# Patient Record
Sex: Female | Born: 1995 | Race: White | Hispanic: No | Marital: Married | Smoking: Former smoker
Health system: Southern US, Community
[De-identification: ages and names within clinical notes are randomized; demographics above are authoritative.]

## PROBLEM LIST (undated history)

## (undated) ENCOUNTER — Inpatient Hospital Stay (HOSPITAL_COMMUNITY): Payer: Self-pay

## (undated) HISTORY — PX: TONSILLECTOMY: SUR1361

---

## 2017-03-26 ENCOUNTER — Inpatient Hospital Stay (HOSPITAL_COMMUNITY)
Admission: AD | Admit: 2017-03-26 | Discharge: 2017-03-26 | Disposition: A | Discharge status: Home / Self Care | Payer: Medicaid Other | Source: Ambulatory Visit | Attending: Obstetrics and Gynecology | Admitting: Obstetrics and Gynecology

## 2017-03-26 ENCOUNTER — Inpatient Hospital Stay (HOSPITAL_COMMUNITY): Payer: Medicaid Other

## 2017-03-26 ENCOUNTER — Encounter (HOSPITAL_COMMUNITY): Payer: Self-pay

## 2017-03-26 DIAGNOSIS — R102 Pelvic and perineal pain: Secondary | ICD-10-CM | POA: Diagnosis not present

## 2017-03-26 DIAGNOSIS — Z3A01 Less than 8 weeks gestation of pregnancy: Secondary | ICD-10-CM | POA: Insufficient documentation

## 2017-03-26 DIAGNOSIS — O3481 Maternal care for other abnormalities of pelvic organs, first trimester: Secondary | ICD-10-CM | POA: Diagnosis not present

## 2017-03-26 DIAGNOSIS — N83201 Unspecified ovarian cyst, right side: Secondary | ICD-10-CM

## 2017-03-26 DIAGNOSIS — O26891 Other specified pregnancy related conditions, first trimester: Secondary | ICD-10-CM | POA: Diagnosis not present

## 2017-03-26 DIAGNOSIS — Z87891 Personal history of nicotine dependence: Secondary | ICD-10-CM | POA: Diagnosis not present

## 2017-03-26 DIAGNOSIS — R109 Unspecified abdominal pain: Secondary | ICD-10-CM | POA: Diagnosis present

## 2017-03-26 LAB — URINALYSIS, ROUTINE W REFLEX MICROSCOPIC
BILIRUBIN URINE: NEGATIVE
Glucose, UA: 50 mg/dL — AB
Hgb urine dipstick: NEGATIVE
KETONES UR: NEGATIVE mg/dL
LEUKOCYTES UA: NEGATIVE
NITRITE: NEGATIVE
PROTEIN: NEGATIVE mg/dL
Specific Gravity, Urine: 1.002 — ABNORMAL LOW (ref 1.005–1.030)
pH: 7 (ref 5.0–8.0)

## 2017-03-26 LAB — HCG, QUANTITATIVE, PREGNANCY: hCG, Beta Chain, Quant, S: 40924 m[IU]/mL — ABNORMAL HIGH (ref <5)

## 2017-03-26 LAB — WET PREP, GENITAL
Clue Cells Wet Prep HPF POC: NONE SEEN
Sperm: NONE SEEN
TRICH WET PREP: NONE SEEN
Yeast Wet Prep HPF POC: NONE SEEN

## 2017-03-26 LAB — CBC
HCT: 34.3 % — ABNORMAL LOW (ref 36.0–46.0)
Hemoglobin: 12.1 g/dL (ref 12.0–15.0)
MCH: 33.2 pg (ref 26.0–34.0)
MCHC: 35.3 g/dL (ref 30.0–36.0)
MCV: 94.2 fL (ref 78.0–100.0)
PLATELETS: 333 10*3/uL (ref 150–400)
RBC: 3.64 MIL/uL — AB (ref 3.87–5.11)
RDW: 12.9 % (ref 11.5–15.5)
WBC: 13.9 10*3/uL — AB (ref 4.0–10.5)

## 2017-03-26 LAB — POCT PREGNANCY, URINE: Preg Test, Ur: POSITIVE — AB

## 2017-03-26 NOTE — MAU Note (Addendum)
Having pain in lower abd for couple wks and getting worse. Denies vag bleeding or d/c. LMP 02/11/17. Some nausea. No diarrhea. Last BM yesterday and normal. Tried Ibuprofen but have to take 5 at a time to help and hurt upper stomach so has been wks since taking anything for pain

## 2017-03-26 NOTE — MAU Provider Note (Signed)
Chief Complaint: Abdominal Pain   First Provider Initiated Contact with Patient 03/26/17 2105        SUBJECTIVE HPI: Holly Palmer is a 21 y.o. G2P0 at [redacted]w[redacted]d by LMP who presents to maternity admissions reporting lower abdominal pain for 2 weeks. Feels like menstrual cramps.  Did miss period but thought it was just related to something else.  Was not trying for conception but not preventing.   Did not know she was pregnant. Has some nausea.. She denies vaginal bleeding, vaginal itching/burning, urinary symptoms, h/a, dizziness, n/v, or fever/chills.    Abdominal Pain  This is a new problem. The current episode started 1 to 4 weeks ago. The onset quality is gradual. The problem occurs intermittently. The problem has been unchanged. The pain is located in the LLQ, RLQ and suprapubic region. The quality of the pain is cramping. The abdominal pain does not radiate. Associated symptoms include nausea. Pertinent negatives include no constipation, diarrhea (did have some loose stools earlier this week), dysuria, fever, headaches, myalgias or vomiting. The pain is aggravated by palpation. The pain is relieved by nothing. Treatments tried: ibuprofen. The treatment provided mild relief.   RN note:: Having pain in lower abd for couple wks and getting worse. Denies vag bleeding or d/c. LMP 02/11/17. Some nausea. No diarrhea. Last BM yesterday and normal. Tried Ibuprofen but have to take 5 at a time to help and hurt upper stomach so has been wks since taking anything for pain  History reviewed. No pertinent past medical history. Past Surgical History:  Procedure Laterality Date  . TONSILLECTOMY     Social History   Social History  . Marital status: Married    Spouse name: N/A  . Number of children: N/A  . Years of education: N/A   Occupational History  . Not on file.   Social History Main Topics  . Smoking status: Former Smoker    Years: 1.00  . Smokeless tobacco: Never Used  . Alcohol use No  .  Drug use: No  . Sexual activity: Yes    Birth control/ protection: None   Other Topics Concern  . Not on file   Social History Narrative  . No narrative on file   No current facility-administered medications on file prior to encounter.    No current outpatient prescriptions on file prior to encounter.   Allergies  Allergen Reactions  . Lamictal [Lamotrigine] Rash    I have reviewed patient's Past Medical Hx, Surgical Hx, Family Hx, Social Hx, medications and allergies.   ROS:  Review of Systems  Constitutional: Negative for fever.  Gastrointestinal: Positive for abdominal pain and nausea. Negative for constipation, diarrhea (did have some loose stools earlier this week) and vomiting.  Genitourinary: Negative for dysuria.  Musculoskeletal: Negative for myalgias.  Neurological: Negative for headaches.   Review of Systems  Other systems negative   Physical Exam  Physical Exam Patient Vitals for the past 24 hrs:  BP Temp Temp src Pulse Resp SpO2 Height Weight  03/26/17 2101 100/67 98.5 F (36.9 C) Oral 92 16 - - -  03/26/17 1940 - - - (!) 106 - 100 % - -  03/26/17 1939 108/74 99.3 F (37.4 C) - (!) 117 18 -  (1.499 m) 91 lb (41.3 kg)   Constitutional: Well-developed, well-nourished female in no acute distress.  Cardiovascular: normal rate Respiratory: normal effort GI: Abd soft, non-tender. Pos BS x 4 MS: Extremities nontender, no edema, normal ROM Neurologic: Alert and oriented  x 4.  GU: Neg CVAT.  PELVIC EXAM: Cervix pink, visually closed, without lesion, scant white creamy discharge, vaginal walls and external genitalia normal Bimanual exam: Cervix 0/long/high, firm, anterior, neg CMT, uterus tender, 5-6wk size, adnexa with some tenderness on right,  No enlargement, or mass   LAB RESULTS Results for orders placed or performed during the hospital encounter of 03/26/17 (from the past 24 hour(s))  Urinalysis, Routine w reflex microscopic     Status: Abnormal    Collection Time: 03/26/17  7:45 PM  Result Value Ref Range   Color, Urine COLORLESS (A) YELLOW   APPearance CLEAR CLEAR   Specific Gravity, Urine 1.002 (L) 1.005 - 1.030   pH 7.0 5.0 - 8.0   Glucose, UA 50 (A) NEGATIVE mg/dL   Hgb urine dipstick NEGATIVE NEGATIVE   Bilirubin Urine NEGATIVE NEGATIVE   Ketones, ur NEGATIVE NEGATIVE mg/dL   Protein, ur NEGATIVE NEGATIVE mg/dL   Nitrite NEGATIVE NEGATIVE   Leukocytes, UA NEGATIVE NEGATIVE  Pregnancy, urine POC     Status: Abnormal   Collection Time: 03/26/17  8:47 PM  Result Value Ref Range   Preg Test, Ur POSITIVE (A) NEGATIVE  Wet prep, genital     Status: Abnormal   Collection Time: 03/26/17  9:13 PM  Result Value Ref Range   Yeast Wet Prep HPF POC NONE SEEN NONE SEEN   Trich, Wet Prep NONE SEEN NONE SEEN   Clue Cells Wet Prep HPF POC NONE SEEN NONE SEEN   WBC, Wet Prep HPF POC FEW (A) NONE SEEN   Sperm NONE SEEN   CBC     Status: Abnormal   Collection Time: 03/26/17  9:30 PM  Result Value Ref Range   WBC 13.9 (H) 4.0 - 10.5 K/uL   RBC 3.64 (L) 3.87 - 5.11 MIL/uL   Hemoglobin 12.1 12.0 - 15.0 g/dL   HCT 16.1 (L) 09.6 - 04.5 %   MCV 94.2 78.0 - 100.0 fL   MCH 33.2 26.0 - 34.0 pg   MCHC 35.3 30.0 - 36.0 g/dL   RDW 40.9 81.1 - 91.4 %   Platelets 333 150 - 400 K/uL       IMAGING Single IUP at [redacted]w[redacted]d Simple right ovarian cyst, about 5cm  MAU Management/MDM: Ordered usual first trimester r/o ectopic labs.   Pelvic exam and cultures done Will check baseline Ultrasound to rule out ectopic.  This bleeding/pain can represent a normal pregnancy with bleeding, spontaneous abortion or even an ectopic which can be life-threatening.  The process as listed above helps to determine which of these is present.  Discussed results of [place].  Discussed pain is likely due to simple cyst on right.   ASSESSMENT Pregnancy of unknown location  >> live single IUP at [redacted]w[redacted]d Pelvic pain in early pregnancy  PLAN Discharge home Comfort  measures Encouraged to seek PNcare Pt stable at time of discharge. Encouraged to return here or to other Urgent Care/ED if she develops worsening of symptoms, increase in pain, fever, or other concerning symptoms.    Wynelle Bourgeois CNM, MSN Certified Nurse-Midwife 03/26/2017  9:06 PM

## 2017-03-26 NOTE — Discharge Instructions (Signed)
Ovarian Cyst An ovarian cyst is a fluid-filled sac on an ovary. The ovaries are organs that make eggs in women. Most ovarian cysts go away on their own and are not cancerous (are benign). Some cysts need treatment. Follow these instructions at home:  Take over-the-counter and prescription medicines only as told by your doctor.  Do not drive or use heavy machinery while taking prescription pain medicine.  Get pelvic exams and Pap tests as often as told by your doctor.  Return to your normal activities as told by your doctor. Ask your doctor what activities are safe for you.  Do not use any products that contain nicotine or tobacco, such as cigarettes and e-cigarettes. If you need help quitting, ask your doctor.  Keep all follow-up visits as told by your doctor. This is important. Contact a doctor if:  Your periods are:  Late.  Irregular.  Painful.  Your periods stop.  You have pelvic pain that does not go away.  You have pressure on your bladder.  You have trouble making your bladder empty when you pee (urinate).  You have pain during sex.  You have any of the following in your belly (abdomen):  A feeling of fullness.  Pressure.  Discomfort.  Pain that does not go away.  Swelling.  You feel sick most of the time.  You have trouble pooping (have constipation).  You are not as hungry as usual (you lose your appetite).  You get very bad acne.  You start to have more hair on your body and face.  You are gaining weight or losing weight without changing your exercise and eating habits.  You think you may be pregnant. Get help right away if:  You have belly pain that is very bad or gets worse.  You cannot eat or drink without throwing up (vomiting).  You suddenly get a fever.  Your period is a lot heavier than usual. This information is not intended to replace advice given to you by your health care provider. Make sure you discuss any questions you have  with your health care provider. Document Released: 06/01/2008 Document Revised: 07/03/2016 Document Reviewed: 05/17/2016 Elsevier Interactive Patient Education  2017 [employer].   First Trimester of Pregnancy The first trimester of pregnancy is from week 1 until the end of week 13 (months 1 through 3). During this time, your baby will begin to develop inside you. At 6-8 weeks, the eyes and face are formed, and the heartbeat can be seen on ultrasound. At the end of 12 weeks, all the baby's organs are formed. Prenatal care is all the medical care you receive before the birth of your baby. Make sure you get good prenatal care and follow all of your doctor's instructions. Follow these instructions at home: Medicines   Take over-the-counter and prescription medicines only as told by your doctor. Some medicines are safe and some medicines are not safe during pregnancy.  Take a prenatal vitamin that contains at least 600 micrograms (mcg) of folic acid.  If you have trouble pooping (constipation), take medicine that will make your stool soft (stool softener) if your doctor approves. Eating and drinking   Eat regular, healthy meals.  Your doctor will tell you the amount of weight gain that is right for you.  Avoid raw meat and uncooked cheese.  If you feel sick to your stomach (nauseous) or throw up (vomit):  Eat 4 or 5 small meals a day instead of 3 large meals.  Try  eating a few soda crackers.  Drink liquids between meals instead of during meals.  To prevent constipation:  Eat foods that are high in fiber, like fresh fruits and vegetables, whole grains, and beans.  Drink enough fluids to keep your pee (urine) clear or pale yellow. Activity   Exercise only as told by your doctor. Stop exercising if you have cramps or pain in your lower belly (abdomen) or low back.  Do not exercise if it is too hot, too humid, or if you are in a place of great height (high altitude).  Try to avoid  standing for long periods of time. Move your legs often if you must stand in one place for a long time.  Avoid heavy lifting.  Wear low-heeled shoes. Sit and stand up straight.  You can have sex unless your doctor tells you not to. Relieving pain and discomfort   Wear a good support bra if your breasts are sore.  Take warm water baths (sitz baths) to soothe pain or discomfort caused by hemorrhoids. Use hemorrhoid cream if your doctor says it is okay.  Rest with your legs raised if you have leg cramps or low back pain.  If you have puffy, bulging veins (varicose veins) in your legs:  Wear support hose or compression stockings as told by your doctor.  Raise (elevate) your feet for 15 minutes, 3-4 times a day.  Limit salt in your food. Prenatal care   Schedule your prenatal visits by the twelfth week of pregnancy.  Write down your questions. Take them to your prenatal visits.  Keep all your prenatal visits as told by your doctor. This is important. Safety   Wear your seat belt at all times when driving.  Make a list of emergency phone numbers. The list should include numbers for family, friends, the hospital, and police and fire departments. General instructions   Ask your doctor for a referral to a local prenatal class. Begin classes no later than at the start of month 6 of your pregnancy.  Ask for help if you need counseling or if you need help with nutrition. Your doctor can give you advice or tell you where to go for help.  Do not use hot tubs, steam rooms, or saunas.  Do not douche or use tampons or scented sanitary pads.  Do not cross your legs for long periods of time.  Avoid all herbs and alcohol. Avoid drugs that are not approved by your doctor.  Do not use any tobacco products, including cigarettes, chewing tobacco, and electronic cigarettes. If you need help quitting, ask your doctor. You may get counseling or other support to help you quit.  Avoid cat litter  boxes and soil used by cats. These carry germs that can cause birth defects in the baby and can cause a loss of your baby (miscarriage) or stillbirth.  Visit your dentist. At home, brush your teeth with a soft toothbrush. Be gentle when you floss. Contact a doctor if:  You are dizzy.  You have mild cramps or pressure in your lower belly.  You have a nagging pain in your belly area.  You continue to feel sick to your stomach, you throw up, or you have watery poop (diarrhea).  You have a bad smelling fluid coming from your vagina.  You have pain when you pee (urinate).  You have increased puffiness (swelling) in your face, hands, legs, or ankles. Get help right away if:  You have a fever.  You  are leaking fluid from your vagina.  You have spotting or bleeding from your vagina.  You have very bad belly cramping or pain.  You gain or lose weight rapidly.  You throw up blood. It may look like coffee grounds.  You are around people who have [place] measles, fifth disease, or chickenpox.  You have a very bad headache.  You have shortness of breath.  You have any kind of trauma, such as from a fall or a car accident. Summary  The first trimester of pregnancy is from week 1 until the end of week 13 (months 1 through 3).  To take care of yourself and your unborn baby, you will need to eat healthy meals, take medicines only if your doctor tells you to do so, and do activities that are safe for you and your baby.  Keep all follow-up visits as told by your doctor. This is important as your doctor will have to ensure that your baby is healthy and growing well. This information is not intended to replace advice given to you by your health care provider. Make sure you discuss any questions you have with your health care provider. Document Released: 06/01/2008 Document Revised: 12/22/2016 Document Reviewed: 12/22/2016 Elsevier Interactive Patient Education  2017 [employer].

## 2017-03-27 LAB — HIV ANTIBODY (ROUTINE TESTING W REFLEX): HIV Screen 4th Generation wRfx: NONREACTIVE

## 2017-03-27 LAB — ABO/RH: ABO/RH(D): O POS

## 2017-03-29 LAB — GC/CHLAMYDIA PROBE AMP (~~LOC~~) NOT AT ARMC
Chlamydia: NEGATIVE
NEISSERIA GONORRHEA: NEGATIVE

## 2017-09-13 IMAGING — US US OB TRANSVAGINAL
1 series · 15 of 28 positions shown · non-contrast
Comparison: None.

CLINICAL DATA: Initial evaluation for acute right lower quadrant
pain for 2 weeks. Currently pregnant. Beta HCG is pending.

EXAM:
OBSTETRIC <14 WK US AND TRANSVAGINAL OB US
TECHNIQUE: Both transabdominal and transvaginal ultrasound examinations were
performed for complete evaluation of the gestation as well as the
maternal uterus, adnexal regions, and pelvic cul-de-sac.
Transvaginal technique was performed to assess early pregnancy.

[Series 1: us ob transvaginal · 15 of 82 slices shown]
[im 1/82]
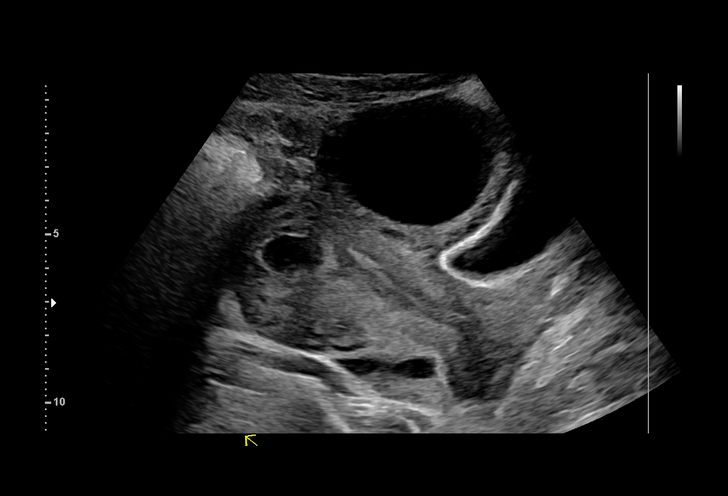
[im 7/82]
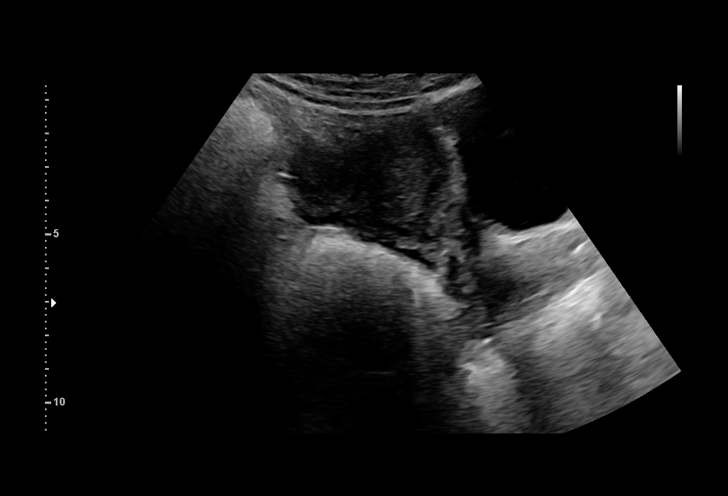
[im 13/82]
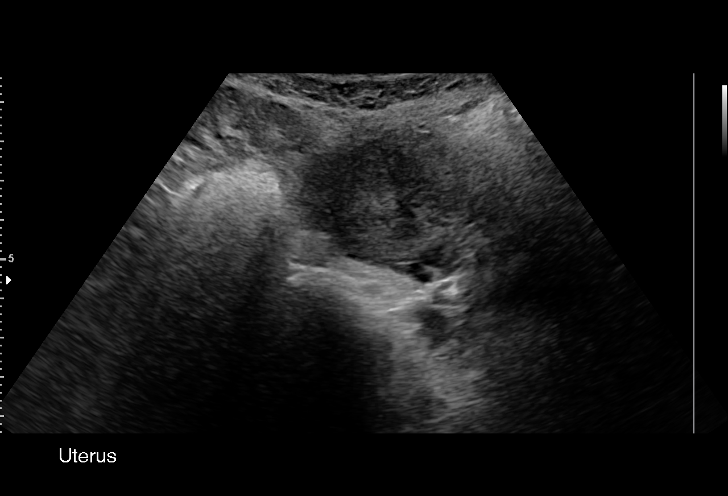
[im 19/82]
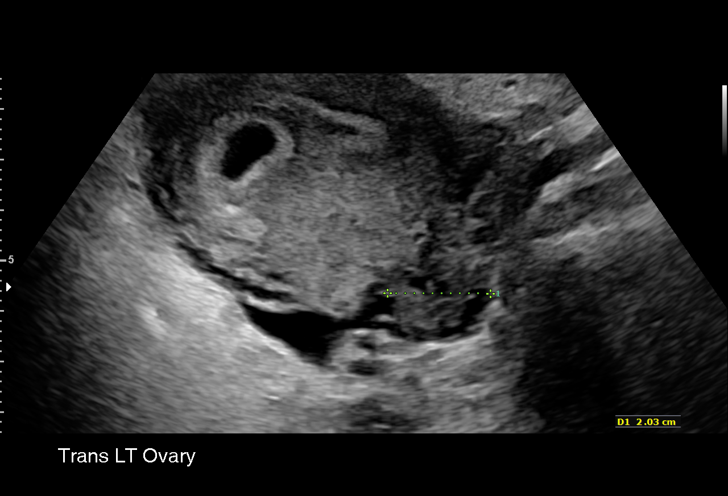
[im 25/82]
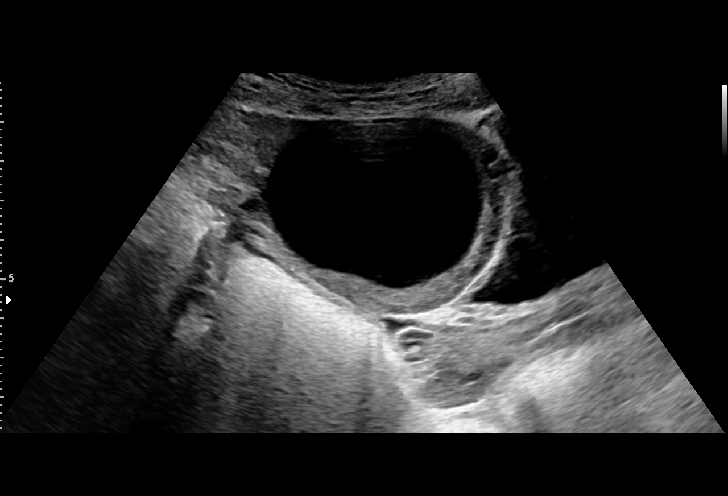
[im 31/82]
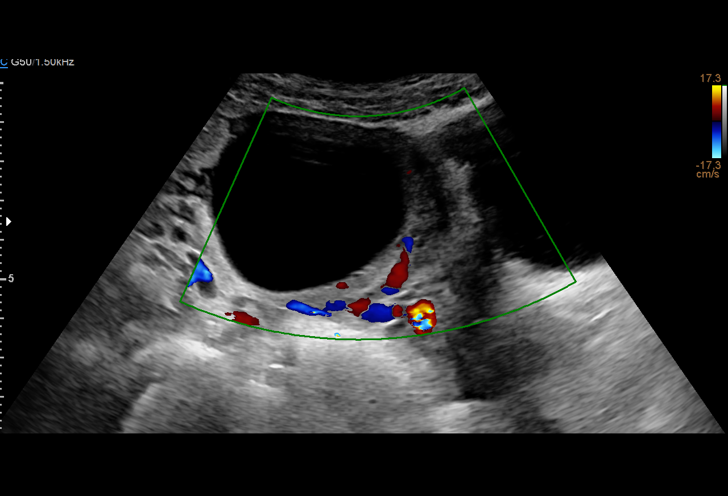
[im 37/82]
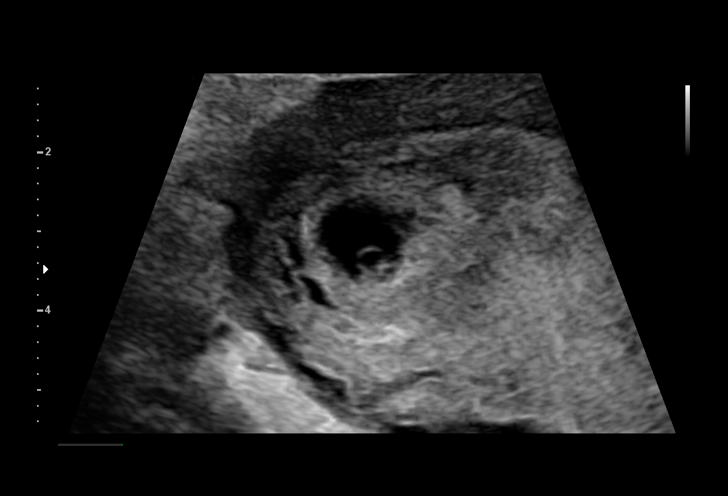
[im 43/82]
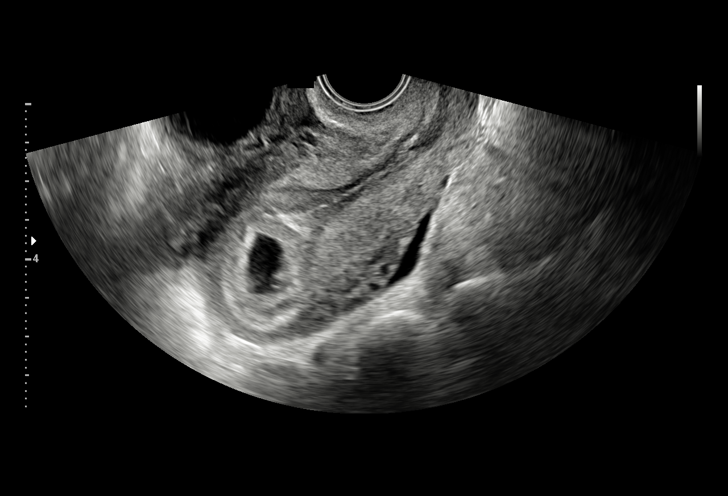
[im 46/82]
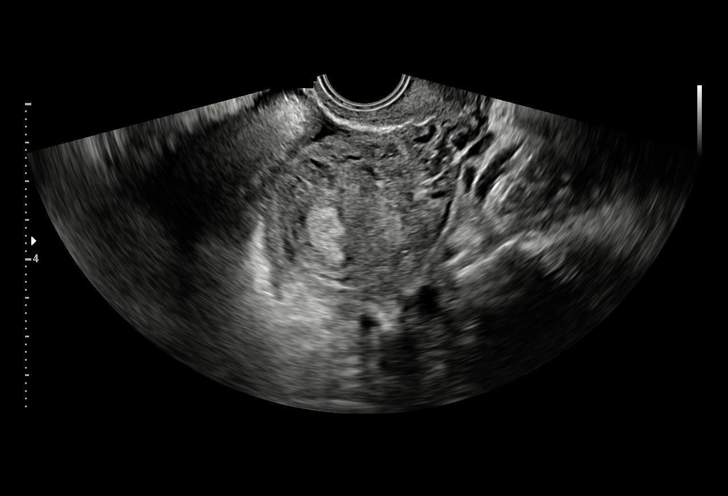
[im 52/82]
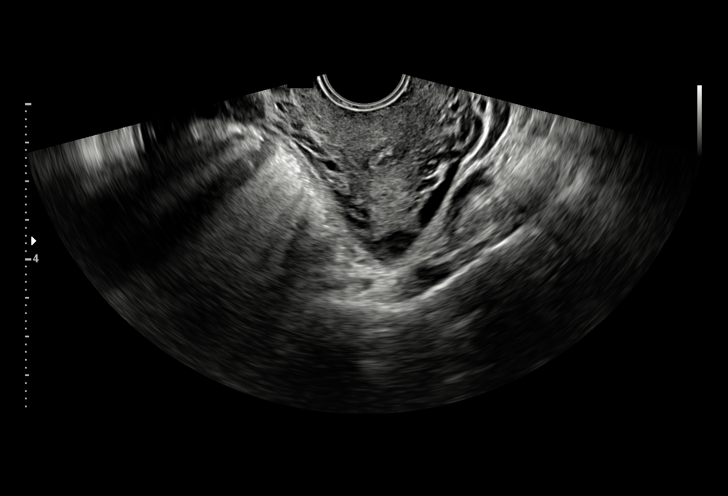
[im 58/82]
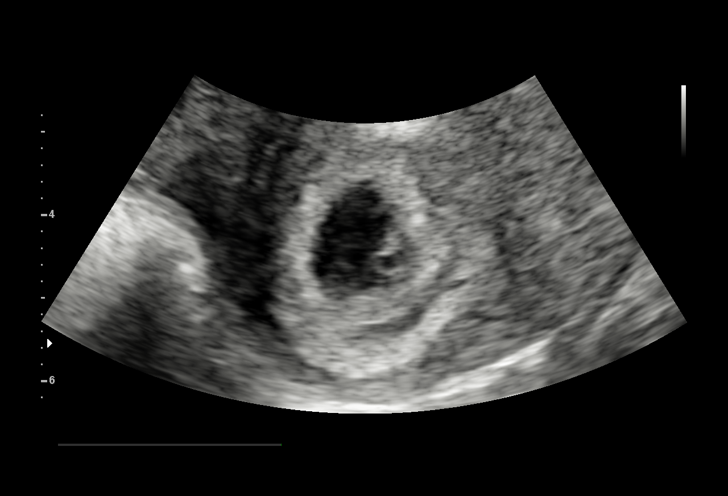
[im 64/82]
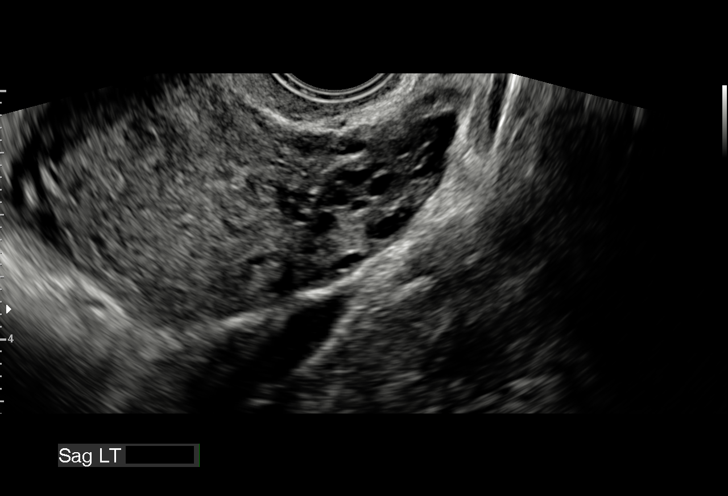
[im 70/82]
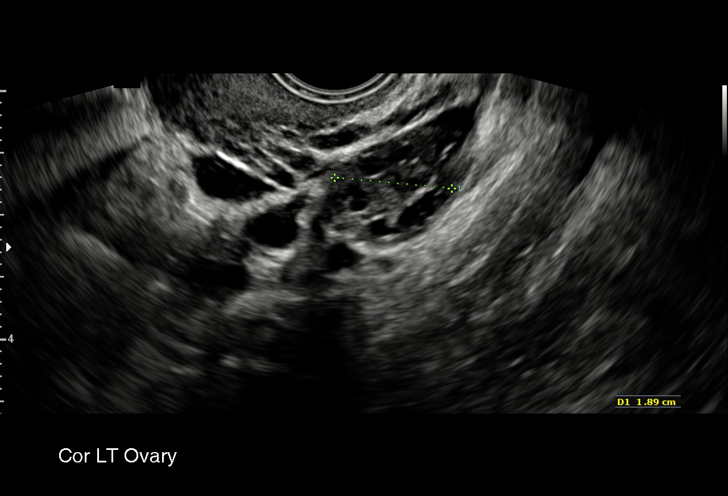
[im 76/82]
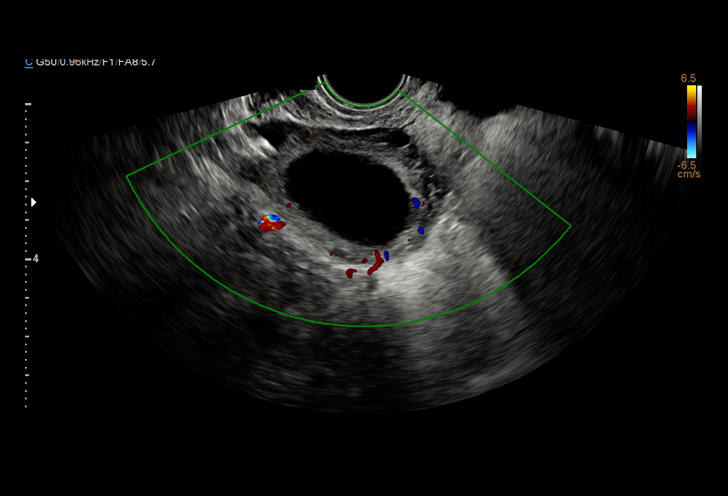
[im 82/82]
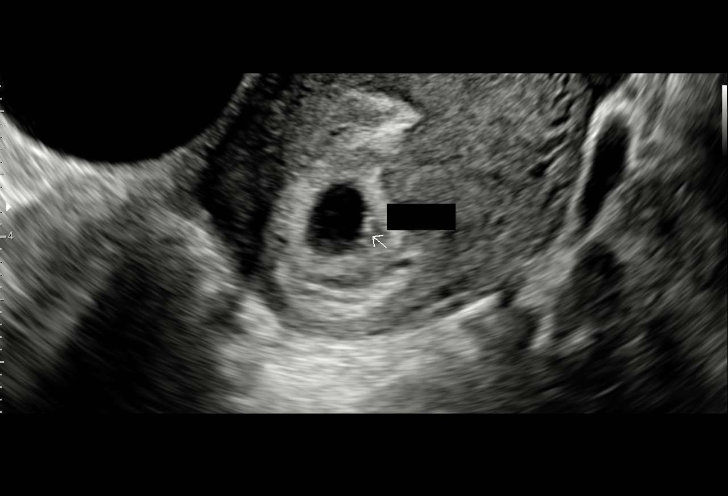

[15 of 28 positions shown; findings below may reference images not displayed]

FINDINGS: Intrauterine gestational sac: Single

Yolk sac:  Present

Embryo:  Present

Cardiac Activity: Present

Heart Rate: 126  bpm

MSD:   mm    w     d

CRL:  3  mm   5 w   5 d                  US EDC: 11/21/2017

Subchorionic hemorrhage:  None visualized.

Maternal uterus/adnexae: Left ovaries unremarkable. A simple
unilocular anechoic cysts measuring 6.1 x 5.2 x 6.0 cm present
within the right adnexa. Vascular flow seen within the adjacent
right ovary. Trace free fluid.
IMPRESSION: 1. Single intrauterine pregnancy as above without complication.
2. 6.1 x 5.2 x 6.0 cm simple right ovarian cyst. As the patient will
presumably have close interval follow-up imaging given the current
pregnancy, attention at follow-up is recommended.
3. Trace free fluid within the pelvis.

## 2018-02-13 ENCOUNTER — Encounter (HOSPITAL_COMMUNITY): Payer: Self-pay
